# Patient Record
Sex: Male | Born: 1975 | Hispanic: Yes | Marital: Married | Smoking: Never smoker
Health system: Southern US, Community
[De-identification: ages and names within clinical notes are randomized; demographics above are authoritative.]

## PROBLEM LIST (undated history)

## (undated) DIAGNOSIS — N289 Disorder of kidney and ureter, unspecified: Secondary | ICD-10-CM

## (undated) DIAGNOSIS — I10 Essential (primary) hypertension: Secondary | ICD-10-CM

---

## 2014-05-03 ENCOUNTER — Encounter (HOSPITAL_BASED_OUTPATIENT_CLINIC_OR_DEPARTMENT_OTHER): Payer: Self-pay | Admitting: Emergency Medicine

## 2014-05-03 ENCOUNTER — Emergency Department (HOSPITAL_BASED_OUTPATIENT_CLINIC_OR_DEPARTMENT_OTHER)
Admission: EM | Admit: 2014-05-03 | Discharge: 2014-05-03 | Disposition: A | Discharge status: Home / Self Care | Payer: 59 | Attending: Emergency Medicine | Admitting: Emergency Medicine

## 2014-05-03 DIAGNOSIS — W503XXA Accidental bite by another person, initial encounter: Secondary | ICD-10-CM | POA: Insufficient documentation

## 2014-05-03 DIAGNOSIS — Z87448 Personal history of other diseases of urinary system: Secondary | ICD-10-CM | POA: Insufficient documentation

## 2014-05-03 DIAGNOSIS — Y990 Civilian activity done for income or pay: Secondary | ICD-10-CM | POA: Insufficient documentation

## 2014-05-03 DIAGNOSIS — Z23 Encounter for immunization: Secondary | ICD-10-CM | POA: Insufficient documentation

## 2014-05-03 DIAGNOSIS — Y9389 Activity, other specified: Secondary | ICD-10-CM | POA: Insufficient documentation

## 2014-05-03 DIAGNOSIS — W319XXA Contact with unspecified machinery, initial encounter: Secondary | ICD-10-CM | POA: Insufficient documentation

## 2014-05-03 DIAGNOSIS — S01502A Unspecified open wound of oral cavity, initial encounter: Secondary | ICD-10-CM | POA: Insufficient documentation

## 2014-05-03 DIAGNOSIS — S01512A Laceration without foreign body of oral cavity, initial encounter: Secondary | ICD-10-CM

## 2014-05-03 DIAGNOSIS — Y9289 Other specified places as the place of occurrence of the external cause: Secondary | ICD-10-CM | POA: Insufficient documentation

## 2014-05-03 HISTORY — DX: Disorder of kidney and ureter, unspecified: N28.9

## 2014-05-03 MED ORDER — BUPIVACAINE-EPINEPHRINE (PF) 0.5% -1:200000 IJ SOLN
1.8000 mL | Freq: Once | INTRAMUSCULAR | Status: AC
  Administered 2014-05-03: 1.8 mL
  Filled 2014-05-03: qty 1.8

## 2014-05-03 MED ORDER — PENICILLIN V POTASSIUM 250 MG PO TABS
500.0000 mg | ORAL_TABLET | Freq: Once | ORAL | Status: AC
  Administered 2014-05-03: 500 mg via ORAL
  Filled 2014-05-03: qty 2

## 2014-05-03 MED ORDER — ACETAMINOPHEN 325 MG PO TABS
650.0000 mg | ORAL_TABLET | Freq: Once | ORAL | Status: AC
  Administered 2014-05-03: 650 mg via ORAL
  Filled 2014-05-03: qty 2

## 2014-05-03 MED ORDER — LIDOCAINE VISCOUS 2 % MT SOLN
20.0000 mL | Freq: Once | OROMUCOSAL | Status: AC
  Administered 2014-05-03: 20 mL via OROMUCOSAL
  Filled 2014-05-03: qty 30

## 2014-05-03 MED ORDER — TETANUS-DIPHTH-ACELL PERTUSSIS 5-2.5-18.5 LF-MCG/0.5 IM SUSP
0.5000 mL | Freq: Once | INTRAMUSCULAR | Status: AC
  Administered 2014-05-03: 0.5 mL via INTRAMUSCULAR
  Filled 2014-05-03: qty 0.5

## 2014-05-03 MED ORDER — PENICILLIN V POTASSIUM 500 MG PO TABS
500.0000 mg | ORAL_TABLET | Freq: Four times a day (QID) | ORAL | Status: AC
Start: 2014-05-03 — End: 2014-05-10

## 2014-05-03 NOTE — ED Provider Notes (Signed)
CSN: [id number]     Arrival date & time 05/03/14  1150 History   First MD Initiated Contact with Patient 05/03/14 1337     Chief Complaint  Patient presents with  . Mouth Injury     (Consider location/radiation/quality/duration/timing/severity/associated sxs/prior Treatment) HPI  Patrick Webb is a 38 y.o. male complaining of laceration to left side of tongue. Patient was using a concrete mixer at work. Somehow, the mixer apparatus came into contact with another piece of equipment which was thrown and hit him in the left mandible. This happened several hours prior to arrival. Patient denies any difficulty opening or closing mouth, tooth pain. States bleeding is largely controlled at this point. He is unsure when his last tetanus shot was.   Past Medical History  Diagnosis Date  . Renal disorder    History reviewed. No pertinent past surgical history. No family history on file. History  Substance Use Topics  . Smoking status: Never Smoker   . Smokeless tobacco: Not on file  . Alcohol Use: Yes     Comment: social    Review of Systems  10 systems reviewed and found to be negative, except as noted in the HPI.   Allergies  Review of patient's allergies indicates no known allergies.  Home Medications   Prior to Admission medications   Medication Sig Start Date End Date Taking? Authorizing Provider  penicillin v potassium (VEETID) 500 MG tablet Take 1 tablet (500 mg total) by mouth 4 (four) times daily. 05/03/14 05/10/14  Krizia Flight, PA-C   BP 145/97  Pulse 79  Temp(Src) 98 F (36.7 C) (Oral)  Resp 18  Ht 5\' 11"  (1.803 m)  Wt 240 lb (108.863 kg)  BMI 33.49 kg/m2  SpO2 98% Physical Exam  Nursing note and vitals reviewed. Constitutional: He is oriented to person, place, and time. He appears well-developed and well-nourished. No distress.  HENT:  Head: Normocephalic.  Mouth/Throat: Oropharynx is clear and moist.    1.5 cm gaping full-thickness laceration to tong.  It is not a through and through. There are no loose teeth. Patient has no trismus, no tenderness to palpation along the mandible.  No crepitance or tenderness to palpation along the orbital rim.   No abnormal otorrhea or rhinorrhea. Nasal septum midline.  No loose teeth or dental pain      Eyes: Conjunctivae and EOM are normal. Pupils are equal, round, and reactive to light.  Cardiovascular: Normal rate.   Pulmonary/Chest: Effort normal. No stridor.  Musculoskeletal: Normal range of motion.  Neurological: He is alert and oriented to person, place, and time.  Psychiatric: He has a normal mood and affect.    ED Course  Procedures (including critical care time)  LACERATION REPAIR Performed by: Wynetta EmeryPISCIOTTA, Hagen Bohorquez Consent: Verbal consent obtained. Risks and benefits: risks, benefits and alternatives were discussed Consent given by: patient Patient identity confirmed: Wrist band   Wound explored to depth in good light on a bloodless field, with no foreign bodies seen or palpated.  Prepped and draped in normal sterile fashion    Tetanus: tdap given  Laceration Location: Left anterior tongue  Laceration Length: 1.5 cm  Anesthesia: Inferior alveolar block  Local anesthetic: Bupivacaine 0.5% with epinephrine  Anesthetic total: 1.8 ml  Irrigation method: Low Pressure  Amount of cleaning: 1L sterile NS  Skin closure: 4-0 Vicryl Rapide  Number of sutures: 1  Technique: Subcuticular  Patient tolerance: Patient tolerated the procedure well with no immediate complications.   Antibx ointment applied. Instructions  for care discussed verbally and patient provided with additional written instructions for homecare and f/u.   Labs Review Labs Reviewed - No data to display  Imaging Review No results found.   EKG Interpretation None      MDM   Final diagnoses:  Tongue laceration, initial encounter    Filed Vitals:   05/03/14 1154 05/03/14 1504  BP: 140/101 145/97    Pulse: 102 79  Temp: 98.3 F (36.8 C) 98 F (36.7 C)  TempSrc: Oral Oral  Resp: 20 18  Height: 5\' 11"  (1.803 m)   Weight: 240 lb (108.863 kg)   SpO2: 100% 98%    Medications  Tdap (BOOSTRIX) injection 0.5 mL (0.5 mLs Intramuscular Given 05/03/14 1351)  acetaminophen (TYLENOL) tablet 650 mg (650 mg Oral Given 05/03/14 1351)  bupivacaine-epinephrine (MARCAINE W/ EPI) 0.5% -1:200000 injection 1.8 mL (1.8 mLs Infiltration Given 05/03/14 1440)  lidocaine (XYLOCAINE) 2 % viscous mouth solution 20 mL (20 mLs Mouth/Throat Given 05/03/14 1440)  penicillin v potassium (VEETID) tablet 500 mg (500 mg Oral Given 05/03/14 1400)    Patrick Webb is a 38 y.o. male presenting with tongue laceration. Wound is gaping. Put one deep suture and advised patient on wound care. Patient will be started on Penicillin VK and advised soft diet and salt water rinses.  Discussed case with attending MD who agrees with plan and stability to d/c to home.   Evaluation does not show pathology that would require ongoing emergent intervention or inpatient treatment. Pt is hemodynamically stable and mentating appropriately. Discussed findings and plan with patient/guardian, who agrees with care plan. All questions answered. Return precautions discussed and outpatient follow up given.   Discharge Medication List as of 05/03/2014  2:58 PM    START taking these medications   Details  penicillin v potassium (VEETID) 500 MG tablet Take 1 tablet (500 mg total) by mouth 4 (four) times daily., Starting 05/03/2014, Last dose on Sun 05/10/14, [employer], PA-C 05/03/14 1829

## 2014-05-03 NOTE — ED Notes (Addendum)
Pt working with machinery, was hit in the chin and bit his tongue. Pt does have a lac appx 1/2 in at the tip of his tongue. Pt drove himself.

## 2014-05-03 NOTE — Discharge Instructions (Signed)
°  Eat a soft diet for the next 48 hours. Rinse periodically with salt water and rinse every time after you eat food.  Take your antibiotics as directed and to completion. You should never have any leftover antibiotics! Push fluids and stay well hydrated.   If you see signs of infection (warmth, redness, tenderness, pus, sharp increase in pain, fever, red streaking) immediately return to the emergency department.  Do not hesitate to return to the Emergency Department for any new, worsening or concerning symptoms.   If you do not have a primary care doctor you can establish one at the   [facility]: [address] [place] [id number] [phone]  After you establish care. Let them know you were seen in the emergency room. They must obtain records for further management.

## 2014-05-03 NOTE — ED Notes (Signed)
Suture cart is at the bedside set up and ready for the doctor to use. 

## 2014-05-04 NOTE — ED Provider Notes (Signed)
Medical screening examination/treatment/procedure(s) were performed by non-physician practitioner and as supervising physician I was immediately available for consultation/collaboration.   Tauni Sanks L Destin Vinsant, MD 05/04/14 1350 

## 2015-03-17 ENCOUNTER — Emergency Department (HOSPITAL_BASED_OUTPATIENT_CLINIC_OR_DEPARTMENT_OTHER)
Admission: EM | Admit: 2015-03-17 | Discharge: 2015-03-17 | Disposition: A | Discharge status: Home / Self Care | Payer: 59 | Attending: Emergency Medicine | Admitting: Emergency Medicine

## 2015-03-17 ENCOUNTER — Encounter (HOSPITAL_BASED_OUTPATIENT_CLINIC_OR_DEPARTMENT_OTHER): Payer: Self-pay | Admitting: *Deleted

## 2015-03-17 ENCOUNTER — Emergency Department (HOSPITAL_BASED_OUTPATIENT_CLINIC_OR_DEPARTMENT_OTHER): Payer: 59

## 2015-03-17 DIAGNOSIS — Z87448 Personal history of other diseases of urinary system: Secondary | ICD-10-CM | POA: Diagnosis not present

## 2015-03-17 DIAGNOSIS — R319 Hematuria, unspecified: Secondary | ICD-10-CM | POA: Insufficient documentation

## 2015-03-17 DIAGNOSIS — R109 Unspecified abdominal pain: Secondary | ICD-10-CM | POA: Diagnosis present

## 2015-03-17 DIAGNOSIS — N508 Other specified disorders of male genital organs: Secondary | ICD-10-CM | POA: Diagnosis not present

## 2015-03-17 LAB — URINALYSIS, ROUTINE W REFLEX MICROSCOPIC
Bilirubin Urine: NEGATIVE
GLUCOSE, UA: NEGATIVE mg/dL
Ketones, ur: NEGATIVE mg/dL
Leukocytes, UA: NEGATIVE
NITRITE: NEGATIVE
Protein, ur: 30 mg/dL — AB
Specific Gravity, Urine: 1.028 (ref 1.005–1.030)
Urobilinogen, UA: 0.2 mg/dL (ref 0.0–1.0)
pH: 5.5 (ref 5.0–8.0)

## 2015-03-17 LAB — URINE MICROSCOPIC-ADD ON

## 2015-03-17 MED ORDER — IBUPROFEN 800 MG PO TABS
800.0000 mg | ORAL_TABLET | Freq: Once | ORAL | Status: AC
  Administered 2015-03-17: 800 mg via ORAL
  Filled 2015-03-17: qty 1

## 2015-03-17 NOTE — Discharge Instructions (Signed)
Hematuria We suspect that you pass a kidney stone today. Take Tylenol or Advil as directed for pain. Call [facility] today to schedule an office visit to look into the microscopic blood seen in your urine. You should also get your blood pressure recheck within the next 3 weeks. Today's was elevated at 159/117. [facility] to get a primary care physician. Hematuria is blood in your urine. It can be caused by a bladder infection, kidney infection, prostate infection, kidney stone, or cancer of your urinary tract. Infections can usually be treated with medicine, and a kidney stone usually will pass through your urine. If neither of these is the cause of your hematuria, further workup to find out the reason may be needed. It is very important that you tell your health care provider about any blood you see in your urine, even if the blood stops without treatment or happens without causing pain. Blood in your urine that happens and then stops and then happens again can be a symptom of a very serious condition. Also, pain is not a symptom in the initial stages of many urinary cancers. HOME CARE INSTRUCTIONS   Drink lots of fluid, 3-4 quarts a day. If you have been diagnosed with an infection, cranberry juice is especially recommended, in addition to large amounts of water.  Avoid caffeine, tea, and carbonated beverages because they tend to irritate the bladder.  Avoid alcohol because it may irritate the prostate.  Take all medicines as directed by your health care provider.  If you were prescribed an antibiotic medicine, finish it all even if you start to feel better.  If you have been diagnosed with a kidney stone, follow your health care provider's instructions regarding straining your urine to catch the stone.  Empty your bladder often. Avoid holding urine for long periods of time.  After a bowel movement, women should cleanse front to back. Use each tissue only  once.  Empty your bladder before and after sexual intercourse if you are a male. SEEK MEDICAL CARE IF:  You develop back pain.  You have a fever.  You have a feeling of sickness in your stomach (nausea) or vomiting.  Your symptoms are not better in 3 days. Return sooner if you are getting worse. SEEK IMMEDIATE MEDICAL CARE IF:   You develop severe vomiting and are unable to keep the medicine down.  You develop severe back or abdominal pain despite taking your medicines.  You begin passing a large amount of blood or clots in your urine.  You feel extremely weak or faint, or you pass out. MAKE SURE YOU:   Understand these instructions.  Will watch your condition.  Will get help right away if you are not doing well or get worse. Document Released: 10/02/2005 Document Revised: 02/16/2014 Document Reviewed: 06/02/2013 [facility] Patient Information 2015 [employer] [place]. This information is not intended to replace advice given to you by your health care provider. Make sure you discuss any questions you have with your health care provider.

## 2015-03-17 NOTE — ED Notes (Signed)
C/o pain in left testicle and left abd. Onset this am.

## 2015-03-17 NOTE — ED Provider Notes (Signed)
CSN: [id number]     Arrival date & time 03/17/15  0807 History   First MD Initiated Contact with Patient 03/17/15 [phone]     No chief complaint on file.    (Consider location/radiation/quality/duration/timing/severity/associated sxs/prior Treatment) HPI Complains of pain which started a left testicle and has migrated to left flank onset 6 AM today. Pain is worse when he sits still improved when he walks. He thinks it feels like a kidney stone that he had 4 years ago. No other associated symptoms.. No treatment prior to coming here no other associated symptoms Past Medical History  Diagnosis Date  . Renal disorder    History reviewed. No pertinent past surgical history. No family history on file. History  Substance Use Topics  . Smoking status: Never Smoker   . Smokeless tobacco: Not on file  . Alcohol Use: Yes     Comment: social    Review of Systems  Constitutional: Negative.   HENT: Negative.   Respiratory: Negative.   Cardiovascular: Negative.   Gastrointestinal: Negative.   Genitourinary: Positive for flank pain and testicular pain.  Musculoskeletal: Negative.   Skin: Negative.   Neurological: Negative.   Psychiatric/Behavioral: Negative.   All other systems reviewed and are negative.     Allergies  Review of patient's allergies indicates no known allergies.  Home Medications   Prior to Admission medications   Not on File   There were no vitals taken for this visit. Physical Exam  Constitutional: He appears well-developed and well-nourished. No distress.  HENT:  Head: Normocephalic and atraumatic.  Eyes: Conjunctivae are normal. Pupils are equal, round, and reactive to light.  Neck: Neck supple. No tracheal deviation present. No thyromegaly present.  Cardiovascular: Normal rate and regular rhythm.   No murmur heard. Pulmonary/Chest: Effort normal and breath sounds normal.  Abdominal: Soft. Bowel sounds are normal. He exhibits no distension. There is no  tenderness.  Genitourinary: Penis normal.  Testes normal. No flank tenderness  Musculoskeletal: Normal range of motion. He exhibits no edema or tenderness.  Neurological: He is alert. Coordination normal.  Skin: Skin is warm and dry. No rash noted.  Psychiatric: He has a normal mood and affect.  Nursing note and vitals reviewed.   ED Course  Procedures (including critical care time) Labs Review Labs Reviewed - No data to display  Imaging Review No results found.   EKG Interpretation None     10:30 AM pain much improved after treatment with ibuprofen 11:05 AM patient asymptomatic pain-free Results for orders placed or performed during the hospital encounter of 03/17/15  Urinalysis, Routine w reflex microscopic (not at [facility])  Result Value Ref Range   Color, Urine AMBER (A) YELLOW   APPearance CLOUDY (A) CLEAR   Specific Gravity, Urine 1.028 1.005 - 1.030   pH 5.5 5.0 - 8.0   Glucose, UA NEGATIVE NEGATIVE mg/dL   Hgb urine dipstick LARGE (A) NEGATIVE   Bilirubin Urine NEGATIVE NEGATIVE   Ketones, ur NEGATIVE NEGATIVE mg/dL   Protein, ur 30 (A) NEGATIVE mg/dL   Urobilinogen, UA 0.2 0.0 - 1.0 mg/dL   Nitrite NEGATIVE NEGATIVE   Leukocytes, UA NEGATIVE NEGATIVE  Urine microscopic-add on  Result Value Ref Range   Squamous Epithelial / LPF RARE RARE   WBC, UA 0-2 <3 WBC/hpf   RBC / HPF TOO NUMEROUS TO COUNT <3 RBC/hpf   Bacteria, UA MANY (A) RARE   Urine-Other MUCOUS PRESENT    US Renal  03/17/2015   CLINICAL DATA:  Left  testicular pain radiating to the left flank. History of multiple renal stones and left ureteral stent in the past.  EXAM: RENAL / URINARY TRACT ULTRASOUND COMPLETE  COMPARISON:  CT abdomen and pelvis 11/21/2010  FINDINGS: Right Kidney:  Length: 13.0 cm. No hydronephrosis. 1.2 x 1.5 x 1.2 cm lesion in the upper pole which is hyperechoic, greatest peripherally, with possible mild posterior acoustic shadowing. No internal vascularity by color Doppler.  Left  Kidney:  Length: 13.5 cm. Echogenicity within normal limits. No mass. Minimal pelviectasis.  Bladder:  Appears normal for degree of bladder distention. Bilateral ureteral jets were visualized.  IMPRESSION: 1. No right hydronephrosis. 1.5 cm hyperechoic focus in the upper pole corresponds to the partially calcified cyst on the prior CT and has not significantly changed in size. 2. Minimal left pelviectasis.   Electronically Signed   By: Sebastian AcheAllen  Grady   On: 03/17/2015 10:17    MDM  I suspect patient suffering ureteral colic. Likely passed kidney stone. I've advised him as to his right renal cyst seen on ultrasound. Plan Tylenol or Advil for pain. [facility]erral [facility] blood pressure recheck 3 weeks Diagnosis #1 left flank pain #2 hematuria #3 elevated blood pressure Final diagnoses:  None        Doug SouSam Reedy Biernat, MD 03/17/15 1108

## 2018-06-24 ENCOUNTER — Emergency Department (HOSPITAL_BASED_OUTPATIENT_CLINIC_OR_DEPARTMENT_OTHER): Payer: Managed Care, Other (non HMO)

## 2018-06-24 ENCOUNTER — Emergency Department (HOSPITAL_BASED_OUTPATIENT_CLINIC_OR_DEPARTMENT_OTHER)
Admission: EM | Admit: 2018-06-24 | Discharge: 2018-06-24 | Disposition: A | Discharge status: Home / Self Care | Payer: Managed Care, Other (non HMO) | Attending: Emergency Medicine | Admitting: Emergency Medicine

## 2018-06-24 ENCOUNTER — Encounter (HOSPITAL_BASED_OUTPATIENT_CLINIC_OR_DEPARTMENT_OTHER): Payer: Self-pay

## 2018-06-24 ENCOUNTER — Other Ambulatory Visit: Payer: Self-pay

## 2018-06-24 DIAGNOSIS — N201 Calculus of ureter: Secondary | ICD-10-CM | POA: Insufficient documentation

## 2018-06-24 DIAGNOSIS — I10 Essential (primary) hypertension: Secondary | ICD-10-CM | POA: Insufficient documentation

## 2018-06-24 DIAGNOSIS — R1030 Lower abdominal pain, unspecified: Secondary | ICD-10-CM | POA: Diagnosis present

## 2018-06-24 HISTORY — DX: Essential (primary) hypertension: I10

## 2018-06-24 LAB — URINALYSIS, ROUTINE W REFLEX MICROSCOPIC
Bilirubin Urine: NEGATIVE
Glucose, UA: NEGATIVE mg/dL
Ketones, ur: 15 mg/dL — AB
Leukocytes, UA: NEGATIVE
NITRITE: NEGATIVE
Protein, ur: 30 mg/dL — AB
Specific Gravity, Urine: >1.03 — ABNORMAL HIGH (ref 1.005–1.030)
pH: 5.5 (ref 5.0–8.0)

## 2018-06-24 LAB — COMPREHENSIVE METABOLIC PANEL
ALBUMIN: 4.1 g/dL (ref 3.5–5.0)
ALT: 31 U/L (ref 0–44)
AST: 25 U/L (ref 15–41)
Alkaline Phosphatase: 49 U/L (ref 38–126)
Anion gap: 10 (ref 5–15)
BUN: 16 mg/dL (ref 6–20)
CALCIUM: 8.3 mg/dL — AB (ref 8.9–10.3)
CO2: 25 mmol/L (ref 22–32)
Chloride: 99 mmol/L (ref 98–111)
Creatinine, Ser: 1.01 mg/dL (ref 0.61–1.24)
GFR calc Af Amer: >60 mL/min (ref >60)
GFR calc non Af Amer: >60 mL/min (ref >60)
GLUCOSE: 125 mg/dL — AB (ref 70–99)
POTASSIUM: 3.7 mmol/L (ref 3.5–5.1)
SODIUM: 134 mmol/L — AB (ref 135–145)
Total Bilirubin: 0.5 mg/dL (ref 0.3–1.2)
Total Protein: 7 g/dL (ref 6.5–8.1)

## 2018-06-24 LAB — URINALYSIS, MICROSCOPIC (REFLEX)

## 2018-06-24 LAB — CBC
HCT: 44.3 % (ref 39.0–52.0)
HEMOGLOBIN: 15.3 g/dL (ref 13.0–17.0)
MCH: 29.7 pg (ref 26.0–34.0)
MCHC: 34.5 g/dL (ref 30.0–36.0)
MCV: 86 fL (ref 78.0–100.0)
Platelets: 190 10*3/uL (ref 150–400)
RBC: 5.15 MIL/uL (ref 4.22–5.81)
RDW: 12.8 % (ref 11.5–15.5)
WBC: 8.6 10*3/uL (ref 4.0–10.5)

## 2018-06-24 LAB — LIPASE, BLOOD: Lipase: 23 U/L (ref 11–51)

## 2018-06-24 MED ORDER — ONDANSETRON HCL 4 MG/2ML IJ SOLN
4.0000 mg | Freq: Once | INTRAMUSCULAR | Status: AC | PRN
  Administered 2018-06-24: 4 mg via INTRAVENOUS
  Filled 2018-06-24: qty 2

## 2018-06-24 MED ORDER — SODIUM CHLORIDE 0.9 % IV BOLUS
1000.0000 mL | Freq: Once | INTRAVENOUS | Status: AC
  Administered 2018-06-24: 1000 mL via INTRAVENOUS

## 2018-06-24 MED ORDER — HYDROCODONE-ACETAMINOPHEN 5-325 MG PO TABS: 1.0000 | ORAL_TABLET | Freq: Four times a day (QID) | ORAL | 0 refills | Status: AC | PRN

## 2018-06-24 MED ORDER — MORPHINE SULFATE (PF) 4 MG/ML IV SOLN
4.0000 mg | Freq: Once | INTRAVENOUS | Status: DC
  Filled 2018-06-24: qty 1

## 2018-06-24 MED ORDER — NAPROXEN 500 MG PO TABS: 500.0000 mg | ORAL_TABLET | Freq: Two times a day (BID) | ORAL | 0 refills | Status: AC

## 2018-06-24 MED ORDER — ACETAMINOPHEN 500 MG PO TABS
1000.0000 mg | ORAL_TABLET | Freq: Once | ORAL | Status: AC
  Administered 2018-06-24: 1000 mg via ORAL
  Filled 2018-06-24: qty 2

## 2018-06-24 MED ORDER — KETOROLAC TROMETHAMINE 30 MG/ML IJ SOLN
30.0000 mg | Freq: Once | INTRAMUSCULAR | Status: AC
Start: 2018-06-24 — End: 2018-06-24
  Administered 2018-06-24: 30 mg via INTRAVENOUS
  Filled 2018-06-24: qty 1

## 2018-06-24 MED ORDER — ONDANSETRON HCL 4 MG PO TABS: 4.0000 mg | ORAL_TABLET | Freq: Three times a day (TID) | ORAL | 0 refills | Status: AC | PRN

## 2018-06-24 MED ORDER — IOPAMIDOL (ISOVUE-300) INJECTION 61%
100.0000 mL | Freq: Once | INTRAVENOUS | Status: AC | PRN
  Administered 2018-06-24: 100 mL via INTRAVENOUS

## 2018-06-24 MED ORDER — TAMSULOSIN HCL 0.4 MG PO CAPS: 0.4000 mg | ORAL_CAPSULE | Freq: Every day | ORAL | 0 refills | Status: AC

## 2018-06-24 NOTE — ED Provider Notes (Signed)
[facility] EMERGENCY DEPARTMENT Provider Note   CSN: [id number] Arrival date & time: 06/24/18  1714     History   Chief Complaint Chief Complaint  Patient presents with  . Abdominal Pain    HPI Patrick Webb is a 42 y.o. male with history of kidney stones and hypertension who presents emergency department today for flank pain, abdominal pain and testicular pain.  Patient reports that last night before lying down for bed he started developing right lower quadrant abdominal pain that radiated into his right testicle.  He states that the pain has been constant since onset and describes as a burning, sharp pain.  Since this morning the pain has progressed to his right flank.  He was seen in urgent care who believed he may have appendicitis or a kidney stone was sent over for further evaluation.  Patient reports he has not been able to urinate since onset of his symptoms.  He developed one episode of emesis with associated constant nausea after leaving urgent care.  He was given Zofran upon arrival that helped relieve his nausea.  He has not taken anything for his pain.  He denies any associated fever, upper abdominal pain, penile discharge, penile pain, painful bowel movements, diarrhea, constipation, rash.  Patient is sexually active with his with. He does not use protection. No history of STI's.  No prior abdominal surgeries.  Last bowel movement this morning and normal.  He is still passing gas.  HPI  Past Medical History:  Diagnosis Date  . Hypertension   . Renal disorder     There are no active problems to display for this patient.   History reviewed. No pertinent surgical history.      Home Medications    Prior to Admission medications   Not on File    Family History No family history on file.  Social History Social History   Tobacco Use  . Smoking status: Never Smoker  Substance Use Topics  . Alcohol use: Yes    Comment: social  . Drug use: Not on  file     Allergies   Patient has no known allergies.   Review of Systems Review of Systems  All other systems reviewed and are negative.    Physical Exam Updated Vital Signs BP (!) 135/103 (BP Location: Left Arm)   Pulse (!) 104   Temp 98.3 F (36.8 C) (Oral)   Resp 18   Ht 5\' 11"  (1.803 m)   Wt 112 kg   SpO2 100%   BMI 34.45 kg/m   Physical Exam  Constitutional: He appears well-developed and well-nourished.  HENT:  Head: Normocephalic and atraumatic.  Right Ear: External ear normal.  Left Ear: External ear normal.  Nose: Nose normal.  Mouth/Throat: Uvula is midline, oropharynx is clear and moist and mucous membranes are normal. No tonsillar exudate.  Eyes: Pupils are equal, round, and reactive to light. Right eye exhibits no discharge. Left eye exhibits no discharge. No scleral icterus.  Neck: Trachea normal. Neck supple. No spinous process tenderness present. No neck rigidity. Normal range of motion present.  Cardiovascular: Normal rate, regular rhythm and intact distal pulses.  No murmur heard. Pulses:      Radial pulses are 2+ on the right side, and 2+ on the left side.       Dorsalis pedis pulses are 2+ on the right side, and 2+ on the left side.       Posterior tibial pulses are 2+ on the  right side, and 2+ on the left side.  No lower extremity swelling or edema. Calves symmetric in size bilaterally.  Pulmonary/Chest: Effort normal and breath sounds normal. He exhibits no tenderness.  Abdominal: Soft. Bowel sounds are normal. He exhibits no distension. There is tenderness in the right lower quadrant. There is no rigidity, no rebound, no guarding, no CVA tenderness and no tenderness at McBurney's point. A hernia is present. Hernia confirmed negative in the right inguinal area and confirmed negative in the left inguinal area.  Genitourinary: Penis normal. Right testis shows swelling and tenderness. Right testis shows no mass. Left testis shows no mass, no swelling  and no tenderness.  Genitourinary Comments: Chaperone present during genital exam. No external genital lesions noted, no bumps on head of penis, specifically no vesicles concerning for herpes or chancre suggestive of syphillis, no pain with palpation, no discharge or urethritis noted.   Musculoskeletal: He exhibits no edema.  Lymphadenopathy:    He has no cervical adenopathy.  Neurological: He is alert.  Skin: Skin is warm and dry. No rash noted. He is not diaphoretic.  Psychiatric: He has a normal mood and affect.  Nursing note and vitals reviewed.    ED Treatments / Results  Labs (all labs ordered are listed, but only abnormal results are displayed) Labs Reviewed  COMPREHENSIVE METABOLIC PANEL - Abnormal; Notable for the following components:      Result Value   Sodium 134 (*)    Glucose, Bld 125 (*)    Calcium 8.3 (*)    All other components within normal limits  URINALYSIS, ROUTINE W REFLEX MICROSCOPIC - Abnormal; Notable for the following components:   Color, Urine AMBER (*)    APPearance CLOUDY (*)    Specific Gravity, Urine >1.030 (*)    Hgb urine dipstick LARGE (*)    Ketones, ur 15 (*)    Protein, ur 30 (*)    All other components within normal limits  URINALYSIS, MICROSCOPIC (REFLEX) - Abnormal; Notable for the following components:   Bacteria, UA FEW (*)    All other components within normal limits  URINE CULTURE  LIPASE, BLOOD  CBC  GC/CHLAMYDIA PROBE AMP ([facility]) NOT AT [facility]    EKG None  Radiology Ct Abdomen Pelvis W Contrast  Result Date: 06/24/2018 CLINICAL DATA:  Right lower quadrant pain EXAM: CT ABDOMEN AND PELVIS WITH CONTRAST TECHNIQUE: Multidetector CT imaging of the abdomen and pelvis was performed using the standard protocol following bolus administration of intravenous contrast. CONTRAST:  ISOVUE-300 IOPAMIDOL (ISOVUE-300) INJECTION 61% COMPARISON:  03/22/2018 FINDINGS: Lower chest: Subsegmental atelectasis. Hepatobiliary: Diffuse  hepatic steatosis.  Normal gallbladder. Pancreas: Unremarkable Spleen: Unremarkable Adrenals/Urinary Tract: Adrenal glands are unremarkable. Moderate right hydronephrosis and perinephric stranding. Small calculus in the upper pole of the right kidney. Small calculus in the anterior mid left kidney. 9 mm calculus at the right ureteropelvic junction. There is stranding along the right ureter. Bladder is decompressed. Stomach/Bowel: Normal appendix. Sigmoid diverticulosis without acute diverticulitis no disproportionate dilatation of small bowel. Stomach is unremarkable Vascular/Lymphatic: No evidence of aortic aneurysm. No abnormal retroperitoneal adenopathy Reproductive: Prostate and seminal vesicles are within normal limits. Other: No free-fluid.  No free intraperitoneal gas. Musculoskeletal: No vertebral compression deformity. IMPRESSION: 9 mm right ureteropelvic junction is associated with secondary findings of right ureteral obstruction. Bilateral nephrolithiasis. Diffuse hepatic steatosis. Electronically Signed   By: Jolaine Click M.D.   On: 06/24/2018 20:49   US Scrotum W/doppler  Result Date: 06/24/2018 CLINICAL DATA:  Right testicular pain for 11 hours EXAM: SCROTAL ULTRASOUND DOPPLER ULTRASOUND OF THE TESTICLES TECHNIQUE: Complete ultrasound examination of the testicles, epididymis, and other scrotal structures was performed. Color and spectral Doppler ultrasound were also utilized to evaluate blood flow to the testicles. COMPARISON:  None. FINDINGS: Right testicle Measurements: 3.6 x 2.6 x 3.2 cm. No mass or microlithiasis visualized. Left testicle Measurements: 4.4 x 2.6 x 2.7 cm. No mass or microlithiasis visualized. Right epididymis: There are 2 epididymal cysts. The largest is 7 mm. Left epididymis:  3 mm epididymal cyst. Hydrocele:  Small bilateral hydrocele. Varicocele:  None visualized. Pulsed Doppler interrogation of both testes demonstrates normal low resistance arterial and venous waveforms  bilaterally. IMPRESSION: No evidence of testicular torsion. Small epididymal cysts bilaterally. Small bilateral hydrocele. Electronically Signed   By: Jolaine Click M.D.   On: 06/24/2018 19:38    Procedures Procedures (including critical care time)  Medications Ordered in ED Medications  sodium chloride 0.9 % bolus 1,000 mL (1,000 mLs Intravenous New Bag/Given 06/24/18 1828)  morphine 4 MG/ML injection 4 mg (4 mg Intravenous Not Given 06/24/18 1828)  ondansetron (ZOFRAN) injection 4 mg (4 mg Intravenous Given 06/24/18 1750)  acetaminophen (TYLENOL) tablet 1,000 mg (1,000 mg Oral Given 06/24/18 1828)    Initial Impression / Assessment and Plan / ED Course  I have reviewed the triage vital signs and the nursing notes.  Pertinent labs & imaging results that were available during my care of the patient were reviewed by me and considered in my medical decision making (see chart for details).     42 year old male presenting with right flank, right lower quadrant and right testicular pain that began this morning with associated urinary symptoms.  Vital signs are reassuring on presentation.  Patient does not meet sepsis criteria.  Abdomen with mild tenderness of the right lower quadrant without peritoneal signs.  Patient is noted to have testicular pain on exam.  Scrotal ultrasound obtained and without evidence of torsion.  G/C sent.  Lab work is reassuring.  No leukocytosis.  No acute kidney injury.  No evidence of UTI.  CT without evidence of appendicitis.  There is a 9 mm ureteral stone at the right UVJ diagnosed by CT scan.  Patient with normal kidney function, and is urinating in the department.  He has no emesis in the department and is tolerating p.o. fluids.  He states his pain is currently 0/10.  Feel he is appropriate for outpatient follow-up.  Will prescribe short course of pain medication, Flomax, Zofran.  Urine culture was sent.  UA was without evidence of UTI. I advised the patient to follow-up  with Urology this week. Specific return precautions discussed. Time was given for all questions to be answered. The patient verbalized understanding and agreement with plan. The patient appears safe for discharge home.   Final Clinical Impressions(s) / ED Diagnoses   Final diagnoses:  Right ureteral stone    ED Discharge Orders         Ordered    ondansetron (ZOFRAN) 4 MG tablet  Every 8 hours PRN     06/24/18 2128    HYDROcodone-acetaminophen (NORCO) 5-325 MG tablet  Every 6 hours PRN     06/24/18 2128    tamsulosin (FLOMAX) 0.4 MG CAPS capsule  Daily     06/24/18 2128    naproxen (NAPROSYN) 500 MG tablet  2 times daily     06/24/18 2128           Tariya Morrissette, Casimiro Needle  Blair Heys 06/25/18 1036    Loren Racer, MD 06/28/18 1536

## 2018-06-24 NOTE — ED Notes (Signed)
Patient transported to CT 

## 2018-06-24 NOTE — ED Triage Notes (Signed)
Pt c/o RLQ pain that radiates to his right flank with vomiting and dysuria, was sent from UC for rule out kidney stone versus appendicitis, pain started last night

## 2018-06-24 NOTE — Discharge Instructions (Signed)
Please read and follow all provided instructions.  You have been diagnosed with kidney stones.    Tests performed today include: Urine test that showed blood in your urine and no infection CT scan which showed a 9 millimeter kidney on the right side at the  Blood test that showed normal kidney function Vital signs. See below for your results today.    You are being provided a prescription for opiates (also known as narcotics) for pain control.  Opiates can be addictive and should only be used when absolutely necessary for pain control when other alternatives do not work.  We recommend you only use them for the recommended amount of time and only as prescribed.  Please do not take with other sedative medications or alcohol.  Please do not drive, operate machinery, or make important decisions while taking opiates.  Please note that these medications can be addictive and have high abuse potential.  Please keep these medications locked away from children, teenagers or any family members with history of substance abuse.  Note that your pain medication contains Acetaminophen (Tylenol), therefore it is not recommended to take additional Tylenol while on your pain medication (please read labels!). Continue to drink fluids to help you pass the stones. I recommend that you double you fluid intake for the next several days. Strain your urine and save any stones that may pass.  Use Zofran for nausea as directed. Flomax is used to help facilitate passing the stone by opening up the Ureters. Followup with your primary care doctor in regards to your hospital visit.  Return to the ED immediately if you develop fever that persists > 101, uncontrolled pain or vomiting, or other concerns.  Read the instructions below to learn more about kidney stones.  If you do not have a primary care doctor to follow-up with, use the resource guide attached to help you find one.  What are Kidney Stones? Kidney stones (ureteral  lithiasis) are solid masses that form inside your kidneys. The intense pain is caused by the stone moving through the kidney, ureter, bladder, and urethra (urinary tract). When the stone moves, the ureter starts to spasm around the stone. The stone is usually passed in the urine. This can take time to occur.  HOME CARE Drink enough fluids to keep your pee (urine) clear or pale yellow. This helps to get the stone out.  Only take medicine as told by your doctor.  Follow up with your doctor as told.  GET HELP RIGHT AWAY IF:  Your pain does not get better with medicine. Or your pain increases and gets worse over 18 hours.  You have a fever (>101).  You have new belly (abdominal) pain.  You have uncontrolled vomiting You feel faint or pass out.  Additional Information:  Your vital signs today were: BP 125/82 (BP Location: Right Arm)    Pulse 82    Temp 98.5 F (36.9 C) (Oral)    Resp 16    Ht 5\' 11"  (1.803 m)    Wt 112 kg    SpO2 99%    BMI 34.45 kg/m  If your blood pressure (BP) was elevated above 135/85 this visit, please have this repeated by your doctor within one month. ---------------

## 2018-06-25 LAB — GC/CHLAMYDIA PROBE AMP (~~LOC~~) NOT AT ARMC
Chlamydia: NEGATIVE
Neisseria Gonorrhea: NEGATIVE

## 2018-06-26 LAB — URINE CULTURE: Culture: NO GROWTH

## 2019-03-08 IMAGING — US US SCROTUM W/ DOPPLER COMPLETE
1 series · 14 of 25 positions shown · non-contrast
Comparison: None.

CLINICAL DATA: Right testicular pain for 11 hours

EXAM:
SCROTAL ULTRASOUND
DOPPLER ULTRASOUND OF THE TESTICLES
TECHNIQUE: Complete ultrasound examination of the testicles, epididymis, and
other scrotal structures was performed. Color and spectral Doppler
ultrasound were also utilized to evaluate blood flow to the
testicles.

[Series 1: us scrotum w/ doppler complete · 0.07mm/px · 14 of 30 slices shown]
[im 1/30]
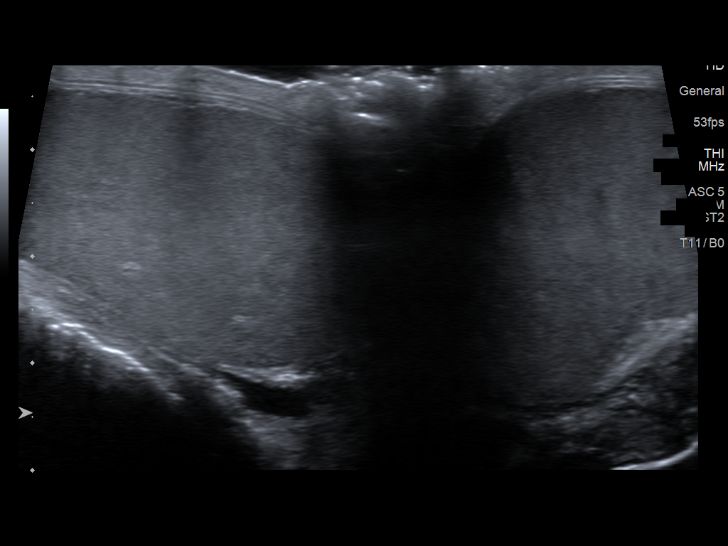
[im 3/30]
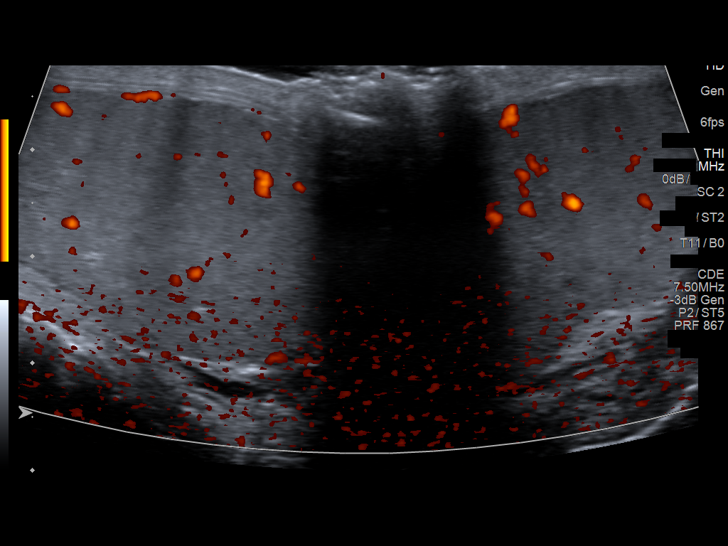
[im 5/30]
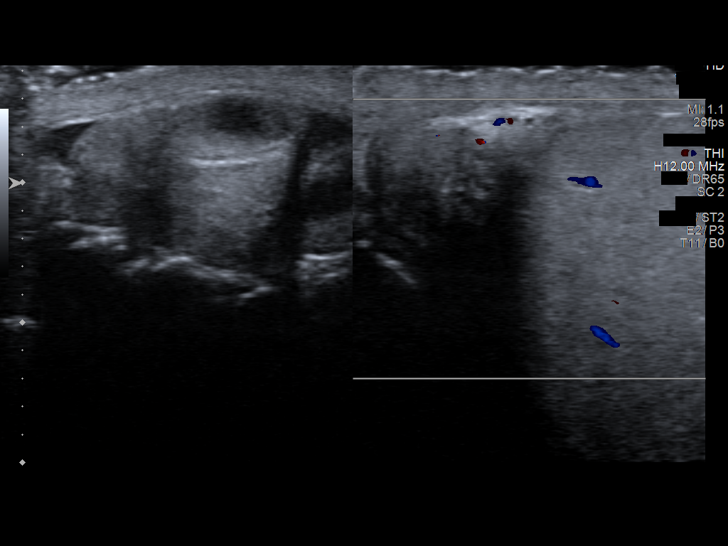
[im 8/30]
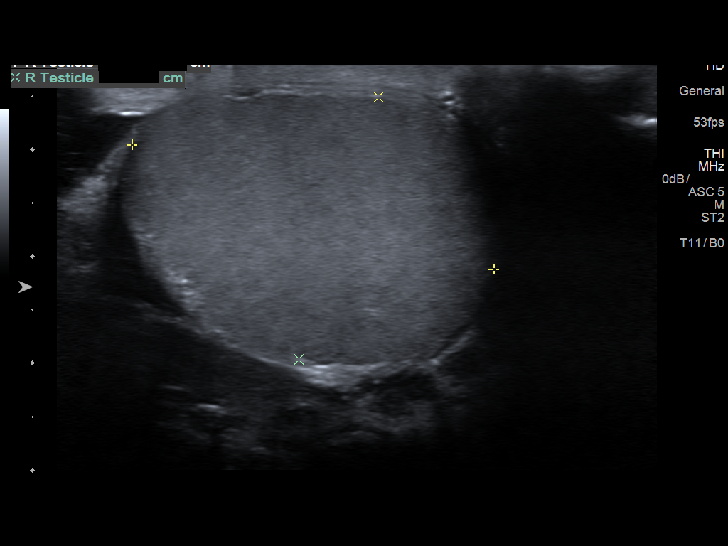
[im 10/30]
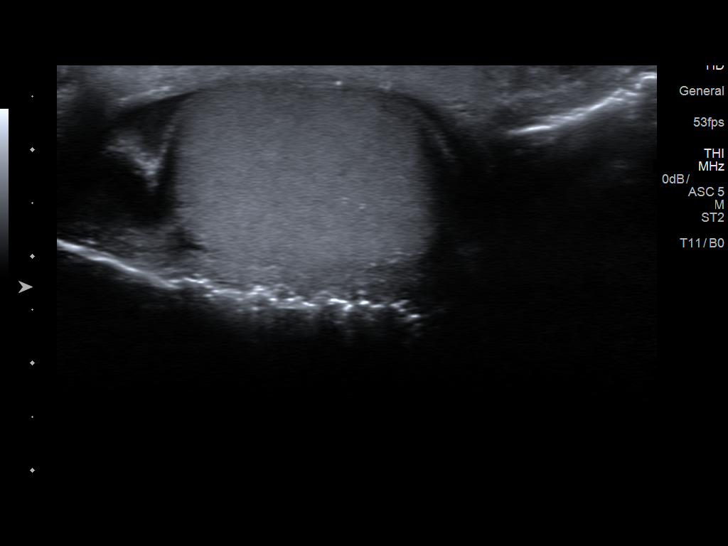
[im 11/30]
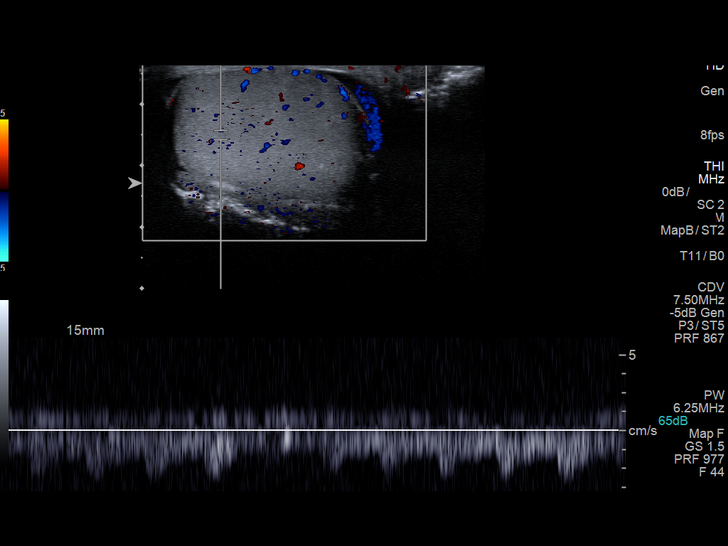
[im 14/30]
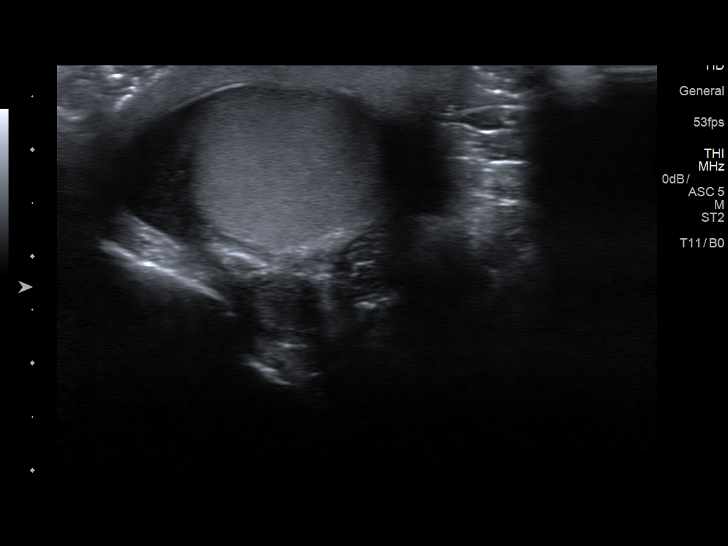
[im 16/30]
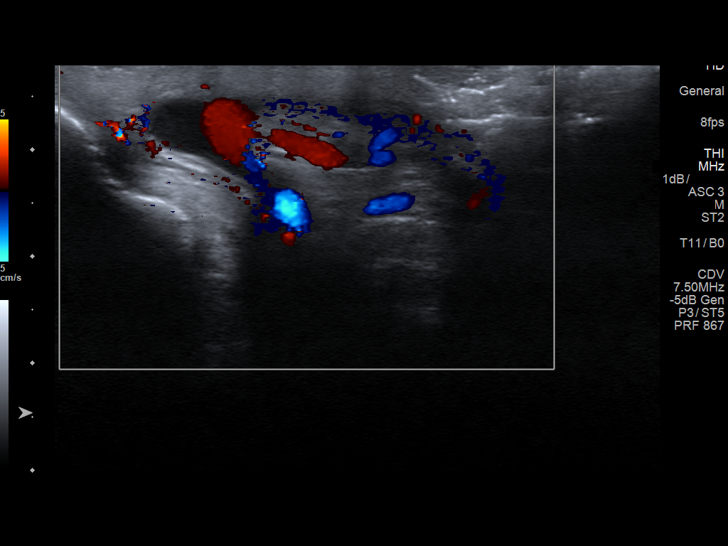
[im 19/30]
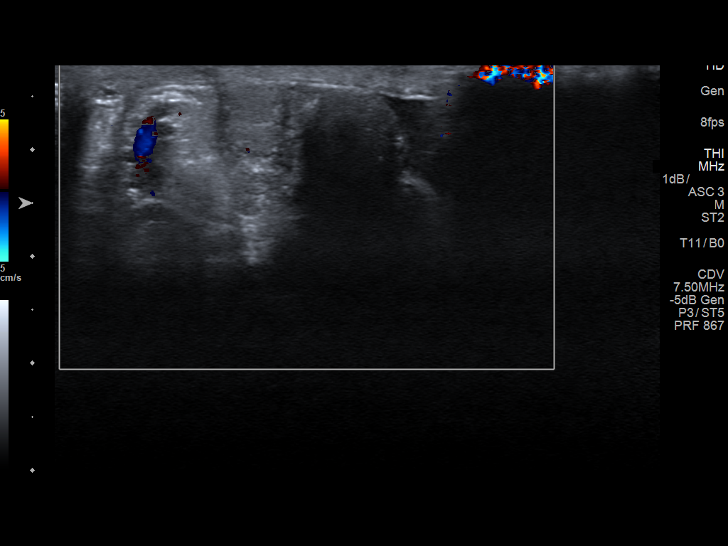
[im 20/30]
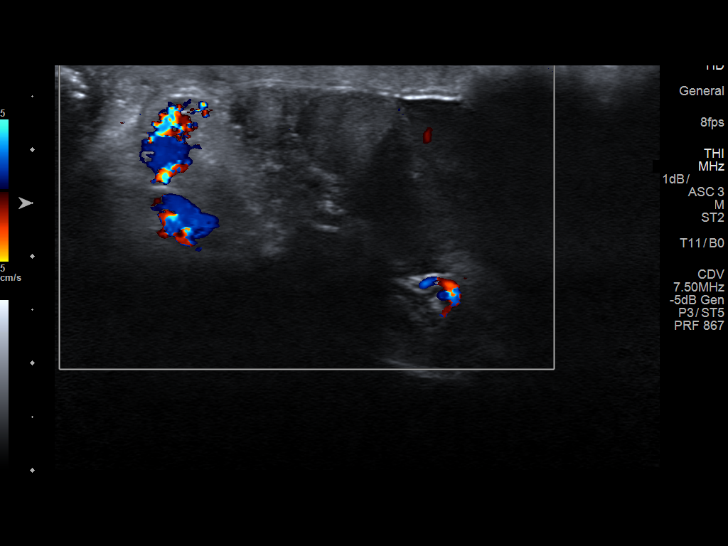
[im 22/30]
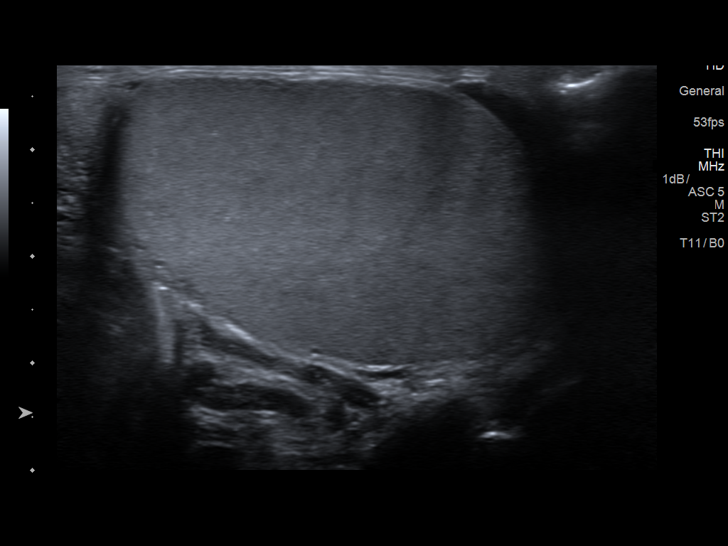
[im 25/30]
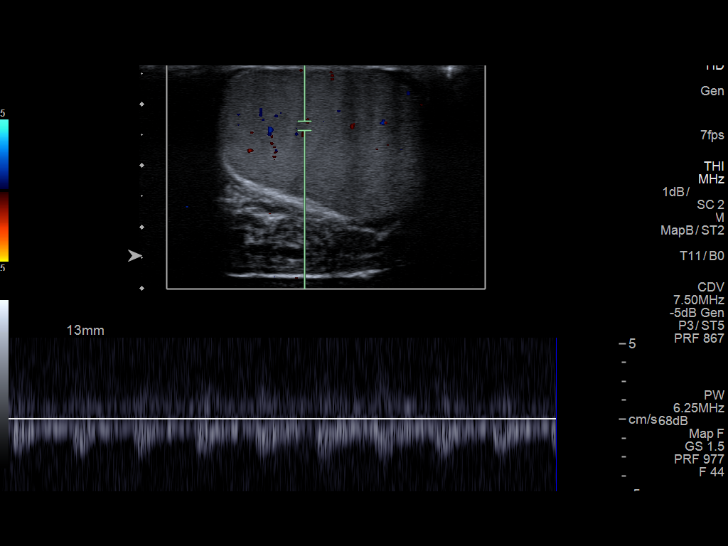
[im 27/30]
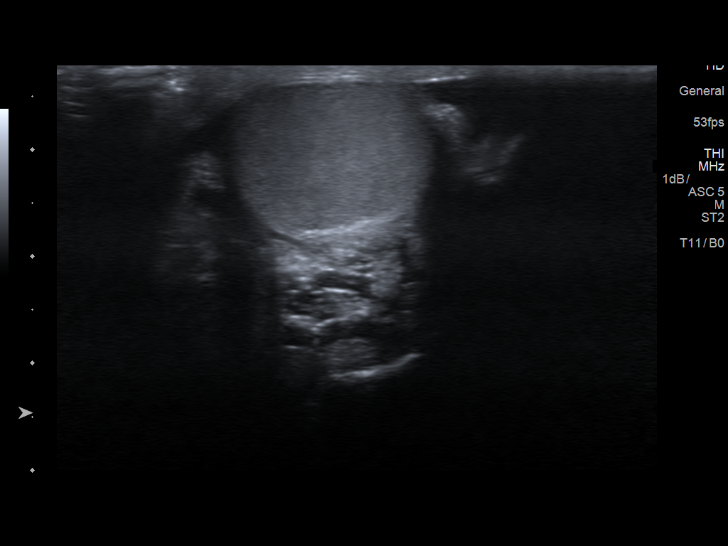
[im 30/30]
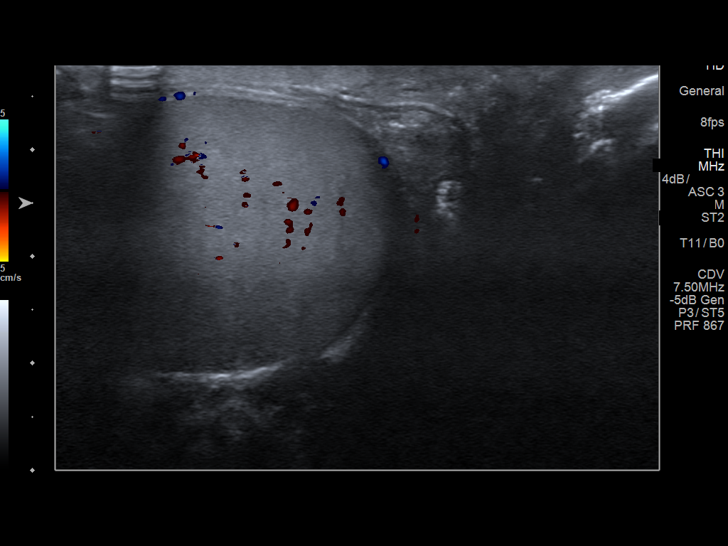

[14 of 25 positions shown; findings below may reference images not displayed]

FINDINGS: Right testicle

Measurements: 3.6 x 2.6 x 3.2 cm. No mass or microlithiasis
visualized.

Left testicle

Measurements: 4.4 x 2.6 x 2.7 cm. No mass or microlithiasis
visualized.

Right epididymis: There are 2 epididymal cysts. The largest is 7 mm.

Left epididymis:  3 mm epididymal cyst.

Hydrocele:  Small bilateral hydrocele.

Varicocele:  None visualized.

Pulsed Doppler interrogation of both testes demonstrates normal low
resistance arterial and venous waveforms bilaterally.
IMPRESSION: No evidence of testicular torsion.

Small epididymal cysts bilaterally.

Small bilateral hydrocele.

## 2020-02-14 DEATH — deceased

## 2020-03-07 IMAGING — CT CT ABD-PELV W/ CM
2 of 5 series · 16 of 46 positions shown, 18 images · IV contrast (iopamidol)
Comparison: 03/22/2018

CLINICAL DATA: Right lower quadrant pain

EXAM:
CT ABDOMEN AND PELVIS WITH CONTRAST
TECHNIQUE: Multidetector CT imaging of the abdomen and pelvis was performed
using the standard protocol following bolus administration of
intravenous contrast.
CONTRAST:  100mL GMOOR4-YSS IOPAMIDOL (GMOOR4-YSS) INJECTION 61%

[Series 2: axial st · axial · 0.78mm/px · z∈[-536,-46]mm · 13 of 110 slices shown, 15 images]
[im 6/110  soft-tissue]
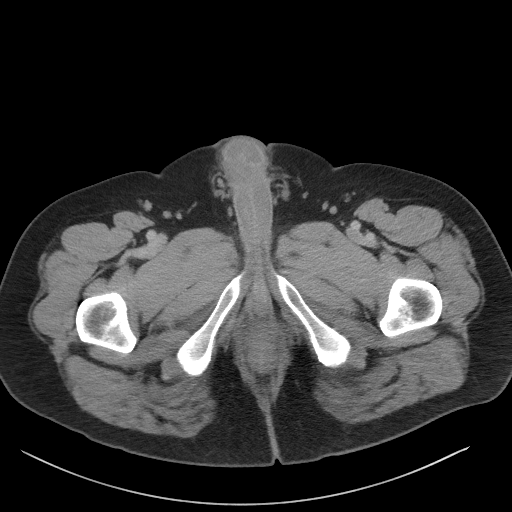
[im 6/110  bone]
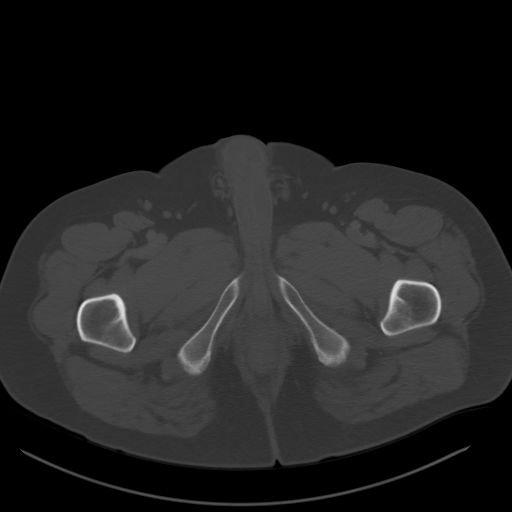
[im 17/110  soft-tissue]
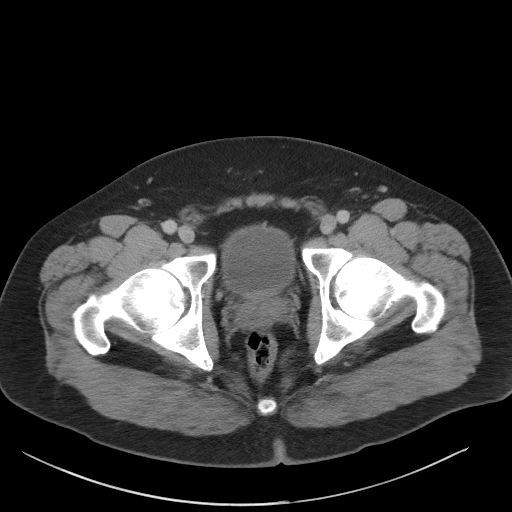
[im 22/110  soft-tissue]
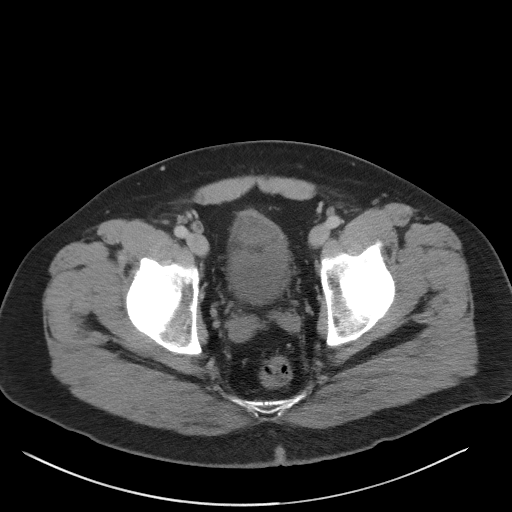
[im 33/110  soft-tissue]
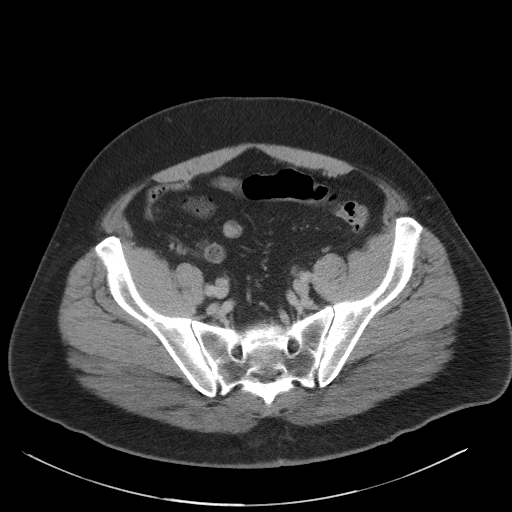
[im 39/110  soft-tissue]
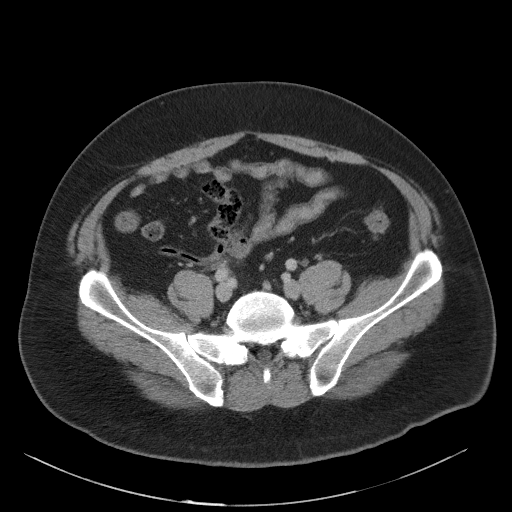
[im 50/110  soft-tissue]
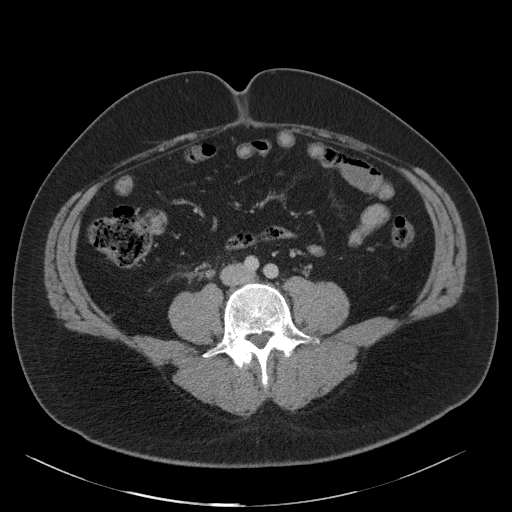
[im 55/110  soft-tissue]
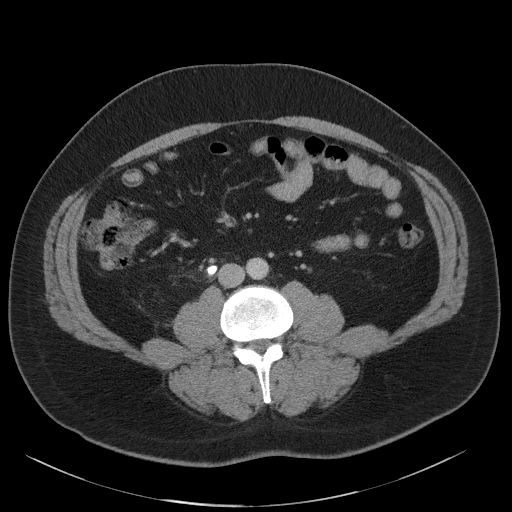
[im 60/110  soft-tissue]
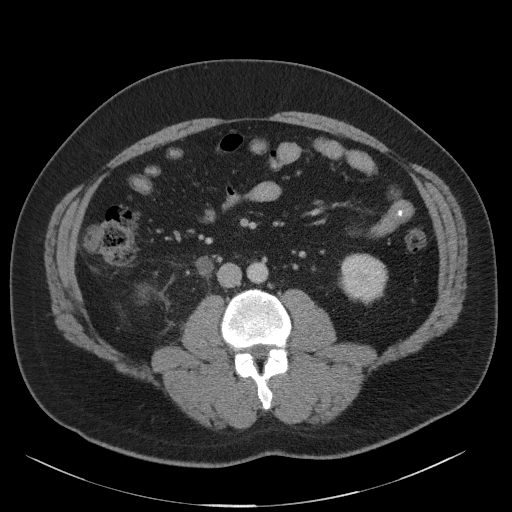
[im 71/110  soft-tissue]
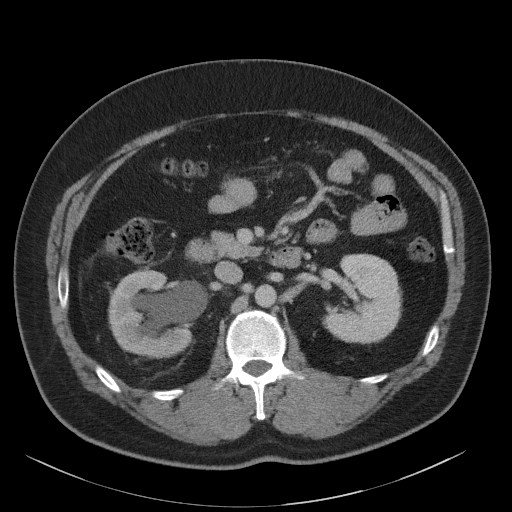
[im 71/110  bone]
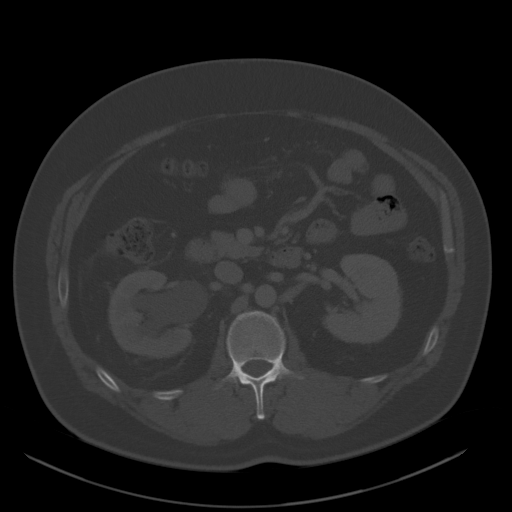
[im 77/110  soft-tissue]
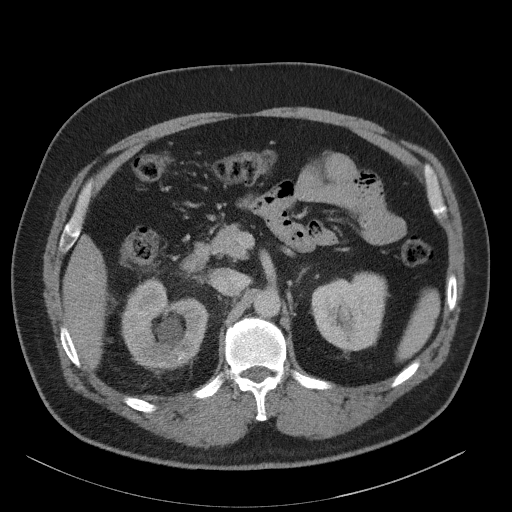
[im 88/110  soft-tissue]
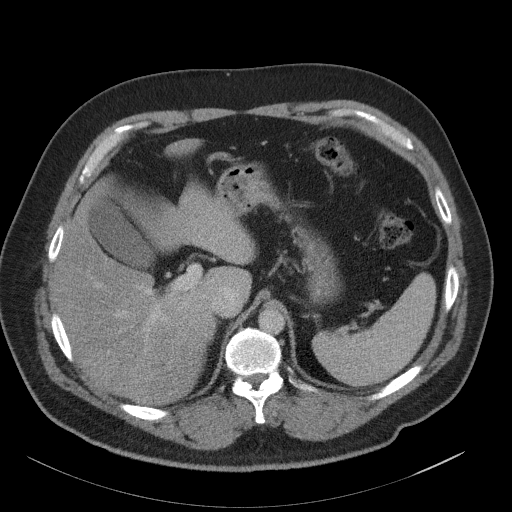
[im 93/110  soft-tissue]
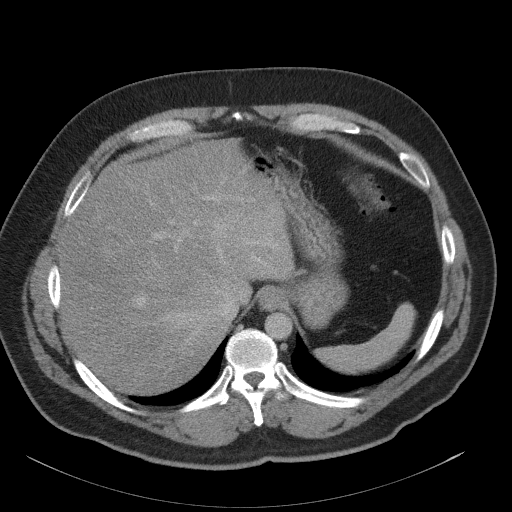
[im 104/110  soft-tissue]
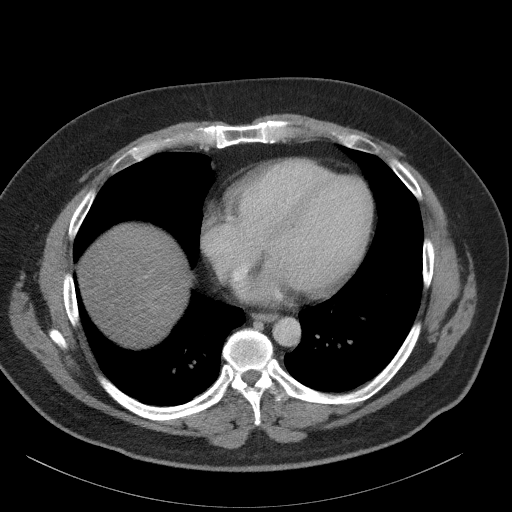

[Series 5: coronal st · coronal · 0.79mm/px · 3 of 114 slices shown]
[im 38/114  soft-tissue]
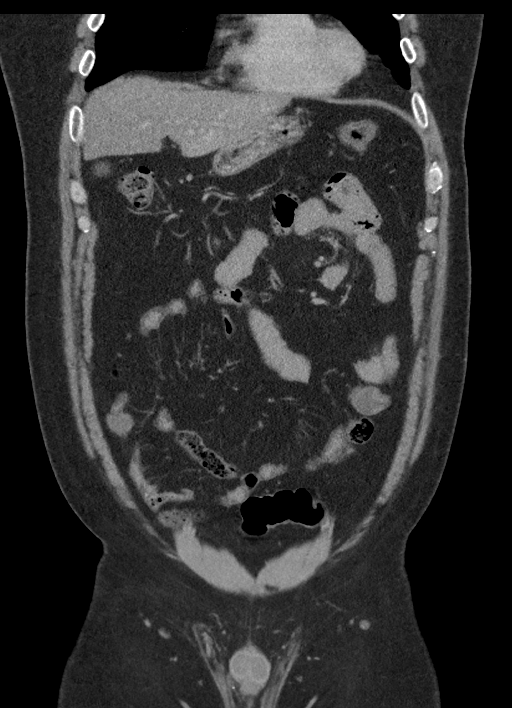
[im 51/114  soft-tissue]
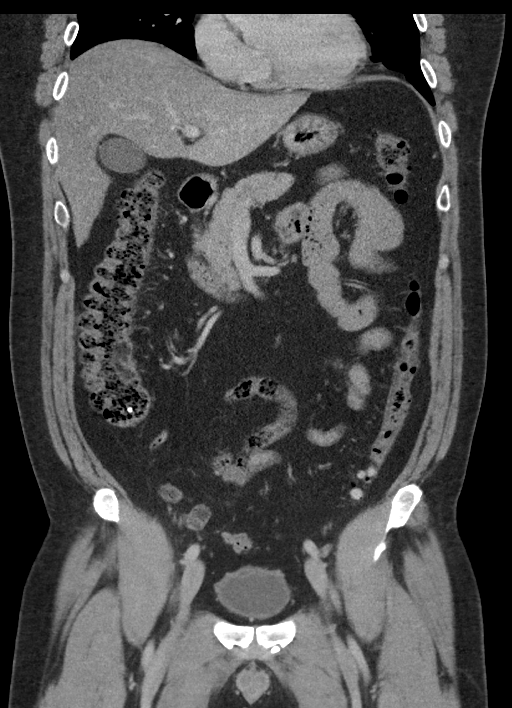
[im 63/114  soft-tissue]
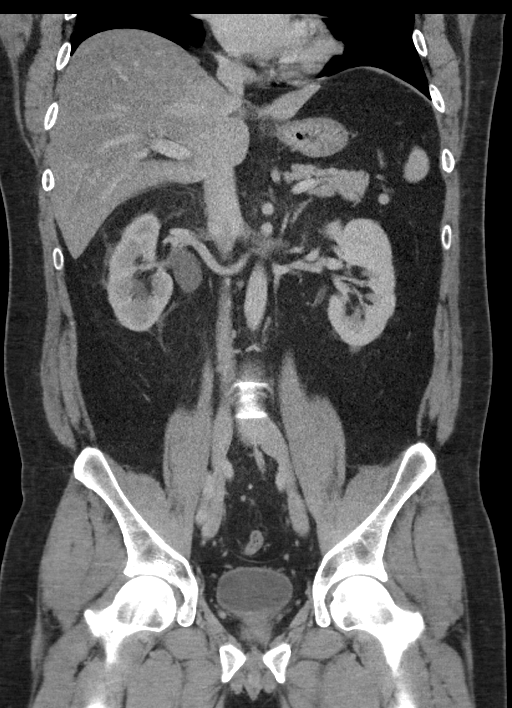

[16 of 46 positions shown; findings below may reference images not displayed]

FINDINGS: Lower chest: Subsegmental atelectasis.

Hepatobiliary: Diffuse hepatic steatosis.  Normal gallbladder.

Pancreas: Unremarkable

Spleen: Unremarkable

Adrenals/Urinary Tract: Adrenal glands are unremarkable. Moderate
right hydronephrosis and perinephric stranding. Small calculus in
the upper pole of the right kidney. Small calculus in the anterior
mid left kidney. 9 mm calculus at the right ureteropelvic junction.
There is stranding along the right ureter. Bladder is decompressed.

Stomach/Bowel: Normal appendix. Sigmoid diverticulosis without acute
diverticulitis no disproportionate dilatation of small bowel.
Stomach is unremarkable

Vascular/Lymphatic: No evidence of aortic aneurysm. No abnormal
retroperitoneal adenopathy

Reproductive: Prostate and seminal vesicles are within normal
limits.

Other: No free-fluid.  No free intraperitoneal gas.

Musculoskeletal: No vertebral compression deformity.
IMPRESSION: 9 mm right ureteropelvic junction is associated with secondary
findings of right ureteral obstruction.

Bilateral nephrolithiasis.

Diffuse hepatic steatosis.
